# Patient Record
Sex: Male | Born: 1935 | Race: Black or African American | Hispanic: No | Marital: Married | Smoking: Never smoker
Health system: Southern US, Community
[De-identification: ages and names within clinical notes are randomized; demographics above are authoritative.]

---

## 2016-06-23 ENCOUNTER — Emergency Department: Payer: Medicare Other

## 2016-06-23 ENCOUNTER — Emergency Department
Admission: EM | Admit: 2016-06-23 | Discharge: 2016-06-23 | Disposition: A | Discharge status: Home / Self Care | Payer: Medicare Other | Attending: Student in an Organized Health Care Education/Training Program | Admitting: Student in an Organized Health Care Education/Training Program

## 2016-06-23 DIAGNOSIS — M79605 Pain in left leg: Secondary | ICD-10-CM | POA: Insufficient documentation

## 2016-06-23 DIAGNOSIS — M79652 Pain in left thigh: Secondary | ICD-10-CM | POA: Diagnosis not present

## 2016-06-23 DIAGNOSIS — W19XXXA Unspecified fall, initial encounter: Secondary | ICD-10-CM | POA: Diagnosis not present

## 2016-06-23 DIAGNOSIS — Y999 Unspecified external cause status: Secondary | ICD-10-CM | POA: Diagnosis not present

## 2016-06-23 DIAGNOSIS — M898X5 Other specified disorders of bone, thigh: Secondary | ICD-10-CM

## 2016-06-23 DIAGNOSIS — R531 Weakness: Secondary | ICD-10-CM | POA: Diagnosis present

## 2016-06-23 DIAGNOSIS — Y9301 Activity, walking, marching and hiking: Secondary | ICD-10-CM | POA: Diagnosis not present

## 2016-06-23 DIAGNOSIS — Y92009 Unspecified place in unspecified non-institutional (private) residence as the place of occurrence of the external cause: Secondary | ICD-10-CM | POA: Diagnosis not present

## 2016-06-23 LAB — CBC WITH DIFFERENTIAL/PLATELET
Basophils Absolute: 0 10*3/uL (ref 0–0.1)
Basophils Relative: 1 %
Eosinophils Absolute: 0 10*3/uL (ref 0–0.7)
Eosinophils Relative: 0 %
HCT: 40.8 % (ref 40.0–52.0)
Hemoglobin: 14 g/dL (ref 13.0–18.0)
Lymphocytes Relative: 20 %
Lymphs Abs: 1.1 10*3/uL (ref 1.0–3.6)
MCH: 29.6 pg (ref 26.0–34.0)
MCHC: 34.4 g/dL (ref 32.0–36.0)
MCV: 86.2 fL (ref 80.0–100.0)
Monocytes Absolute: 0.7 10*3/uL (ref 0.2–1.0)
Monocytes Relative: 12 %
Neutro Abs: 3.7 10*3/uL (ref 1.4–6.5)
Neutrophils Relative %: 67 %
Platelets: 134 10*3/uL — ABNORMAL LOW (ref 150–440)
RBC: 4.73 MIL/uL (ref 4.40–5.90)
RDW: 13.9 % (ref 11.5–14.5)
WBC: 5.5 10*3/uL (ref 3.8–10.6)

## 2016-06-23 LAB — COMPREHENSIVE METABOLIC PANEL
ALT: 10 U/L — ABNORMAL LOW (ref 17–63)
AST: 33 U/L (ref 15–41)
Albumin: 3.8 g/dL (ref 3.5–5.0)
Alkaline Phosphatase: 63 U/L (ref 38–126)
Anion gap: 6 (ref 5–15)
BUN: 15 mg/dL (ref 6–20)
CO2: 29 mmol/L (ref 22–32)
Calcium: 9.5 mg/dL (ref 8.9–10.3)
Chloride: 102 mmol/L (ref 101–111)
Creatinine, Ser: 1 mg/dL (ref 0.61–1.24)
GFR calc Af Amer: >60 mL/min (ref >60)
GFR calc non Af Amer: >60 mL/min (ref >60)
Glucose, Bld: 113 mg/dL — ABNORMAL HIGH (ref 65–99)
Potassium: 4.2 mmol/L (ref 3.5–5.1)
Sodium: 137 mmol/L (ref 135–145)
Total Bilirubin: 0.8 mg/dL (ref 0.3–1.2)
Total Protein: 7.6 g/dL (ref 6.5–8.1)

## 2016-06-23 LAB — URINALYSIS COMPLETE WITH MICROSCOPIC (ARMC ONLY)
Bacteria, UA: NONE SEEN
Bilirubin Urine: NEGATIVE
Glucose, UA: NEGATIVE mg/dL
Hgb urine dipstick: NEGATIVE
Ketones, ur: NEGATIVE mg/dL
Leukocytes, UA: NEGATIVE
Nitrite: NEGATIVE
Protein, ur: NEGATIVE mg/dL
Specific Gravity, Urine: 1.008 (ref 1.005–1.030)
pH: 8 (ref 5.0–8.0)

## 2016-06-23 LAB — CK: Total CK: 142 U/L (ref 49–397)

## 2016-06-23 NOTE — ED Notes (Signed)
Pt brought in via ems from home with weakness to left leg.  Pt states he was walking and leg just gave out.  Pt did not fall.  Pt denies headache.  No chest pain or sob.  No known injury to left leg.  Pt reports he had a severe pain in left hip several days ago, but did not fall.  Pt ambulates with a cane prn.  Iv started and labs sent.  Family with pt in hallway.

## 2016-06-23 NOTE — ED Provider Notes (Signed)
[facility] Emergency Department Provider Note    First MD Initiated Contact with Patient 06/23/16 1614     (approximate)  I have reviewed the triage vital signs and the nursing notes.   HISTORY  Chief Complaint Weakness    HPI Bob Hebert is a 82 y.o. male chief complaint of weakness to his left leg that started several days prior. States he generally ambulates with a cane due to chronic arthritis and left hip pain. States over the past 3 days he's been having difficulty ambulating due to worsening weakness and feeling that his left leg will give out. Denies falling and hitting his head. Denies any sudden onset of back pain. No numbness or tingling. No loss of control of his bowel or bladder. No recent fevers. No cough or shortness of breath. Denies any chest pain. He called EMS today because as he was walking at this Is using a hand rail he was stepping up 1 step and while trying to go up he felt his left knee give out.   History reviewed. No pertinent past medical history.  There are no active problems to display for this patient.   History reviewed. No pertinent surgical history.  Prior to Admission medications   Not on File    Allergies Review of patient's allergies indicates no known allergies.  No family history on file.  Social History Social History  Substance Use Topics  . Smoking status: Never Smoker  . Smokeless tobacco: Never Used  . Alcohol use No    Review of Systems Patient denies headaches, rhinorrhea, blurry vision, numbness, shortness of breath, chest pain, edema, cough, abdominal pain, nausea, vomiting, diarrhea, dysuria, fevers, rashes or hallucinations unless otherwise stated above in HPI. ____________________________________________   PHYSICAL EXAM:  VITAL SIGNS: Vitals:   06/23/16 1725 06/23/16 2007  BP: (!) 171/93 (!) 169/101  Pulse: 70 64  Resp: 16 18  Temp:      Constitutional: Alert and oriented.  Elderly and frail , Well appearing and in no acute distress. Eyes: Conjunctivae are normal. PERRL. EOMI. Head: Atraumatic. Nose: No congestion/rhinnorhea. Mouth/Throat: Mucous membranes are moist.  Oropharynx non-erythematous. Neck: No stridor. Painless ROM. No cervical spine tenderness to palpation Hematological/Lymphatic/Immunilogical: No cervical lymphadenopathy. Cardiovascular: Normal rate, regular rhythm. Grossly normal heart sounds.  Good peripheral circulation. Respiratory: Normal respiratory effort.  No retractions. Lungs CTAB. Gastrointestinal: Soft and nontender. No distention. No abdominal bruits. No CVA tenderness. Genitourinary:  Musculoskeletal: No lower extremity tenderness nor edema.  No joint effusions.  No pain with internal or external rotation of the left hip. No pain with palpation of the left knee but he does have crepitus at time upon extension and flexion. Sensation is intact to light touch distally. Patient does have 4 out of 5 strength of the quadriceps. Normal dorsiflexion of the ankle in plantar flexion. No weakness with flexion of the knee. Neurologic:  Normal speech and language. No gross focal neurologic deficits are appreciated. No gait instability. Skin:  Skin is warm, dry and intact. No rash noted. Psychiatric: Mood and affect are normal. Speech and behavior are normal.  ____________________________________________   LABS (all labs ordered are listed, but only abnormal results are displayed)  No results found for this or any previous visit (from the past 24 hour(s)). ____________________________________________  ____________________________________________  RADIOLOGY  See chart for details ____________________________________________   PROCEDURES  Procedure(s) performed: none    Critical Care performed: no ____________________________________________   INITIAL IMPRESSION / ASSESSMENT AND PLAN /  ED COURSE  Pertinent labs & imaging results  that were available during my care of the patient were reviewed by me and considered in my medical decision making (see chart for details).  DDX: Fracture, contusion, muscle strain, arthritis, CVA, sciatica  Bob Hebert is a 82 y.o. who presents to the ED with intermittent weakness of the left lower extremity causing the patient a fall. Patient without any focal cranial nerve deficits. Based on his symptoms will order CT imaging and radiographic imaging to evaluate for acute traumatic injury. Do not feel his presentation is consistent with cauda equina or spinal stenosis.  Clinical Course  Comment By Time  CT head with no acute abnormality. X-ray lumbar spine shows no acute fracture or malalignment. There is a nonspecific lucency midshaft left femur and based on the patient's pain will further characterize the CT imaging to rule out a subtle fracture. Willy Eddy, MD 09/05 1751  Patient reassessed remains dynamically stable. No acute low back pain on exam. CT without evidence of fracture.  Willy Eddy, MD 09/05 1928  Will ambulate patient. Willy Eddy, MD 09/05 1929  Patient was able to ambulate with a steady gait with use of a walker assistive device. Feel patient is currently stable for further outpatient management. Does not have any persistent neurologic deficits.  Have discussed with the patient and available family all diagnostics and treatments performed thus far and all questions were answered to the best of my ability. The patient demonstrates understanding and agreement with plan.  Willy Eddy, MD 09/05 2000     ____________________________________________   FINAL CLINICAL IMPRESSION(S) / ED DIAGNOSES  Final diagnoses:  Leg pain, anterior, left      NEW MEDICATIONS STARTED DURING THIS VISIT:  New Prescriptions   No medications on file     Note:  This document was prepared using Dragon voice recognition software and may include unintentional  dictation errors.    Willy Eddy, MD 06/23/16 2124

## 2016-06-23 NOTE — ED Notes (Signed)
Pt ambulated with walker with assistance by nurse.  Family with pt in hallway bed.  Pt alert and oriented.

## 2016-06-23 NOTE — ED Triage Notes (Signed)
Pt comes into the ED via EMS from home with c/o left leg weakness, states it just gave out on him and he almost fell today.. Pt normally walks with a cane

## 2016-06-23 NOTE — ED Notes (Signed)
Pt to xray

## 2017-07-12 IMAGING — CR DG LUMBAR SPINE 2-3V
3 series · 3 of 3 positions shown · non-contrast
Comparison: None.

CLINICAL DATA: Back pain and left lower extremity weakness. No
reported injury.

EXAM:
LUMBAR SPINE - 2-3 VIEW

[l-spine ap]
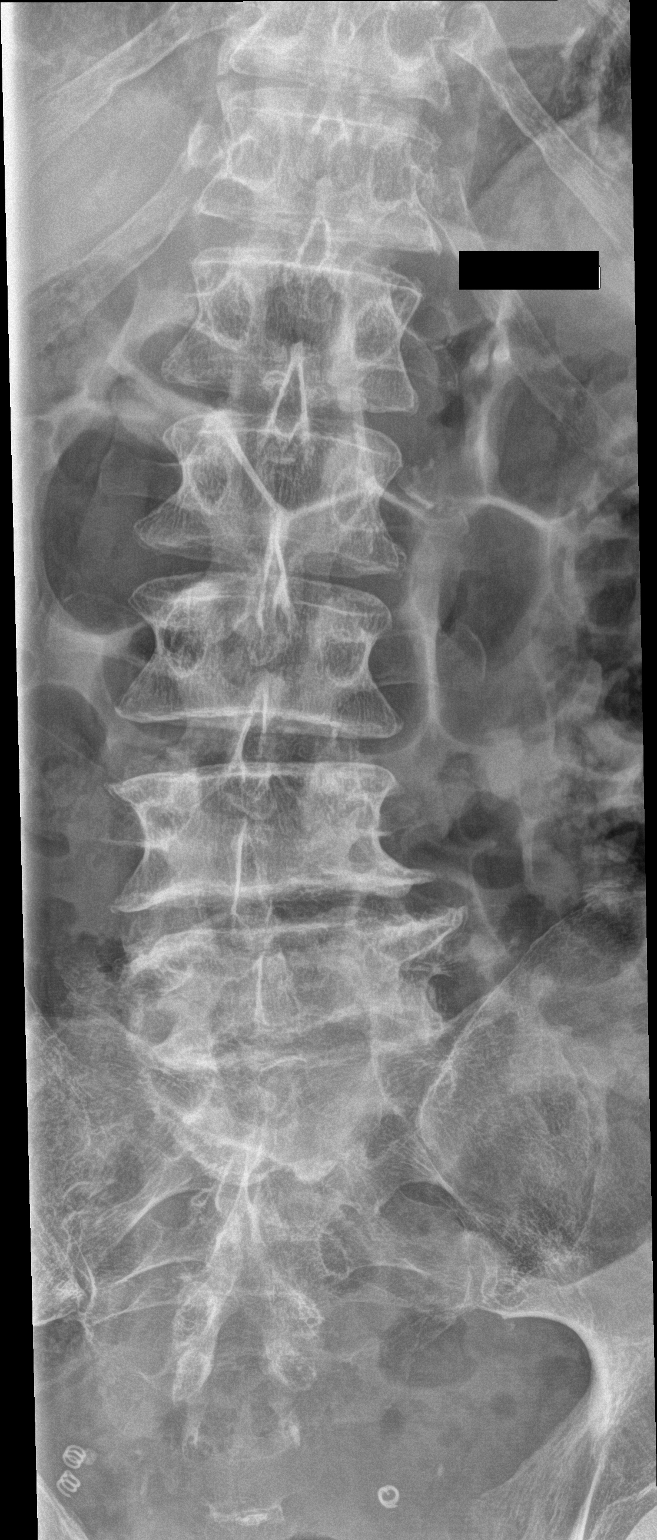

[l-spine lat]
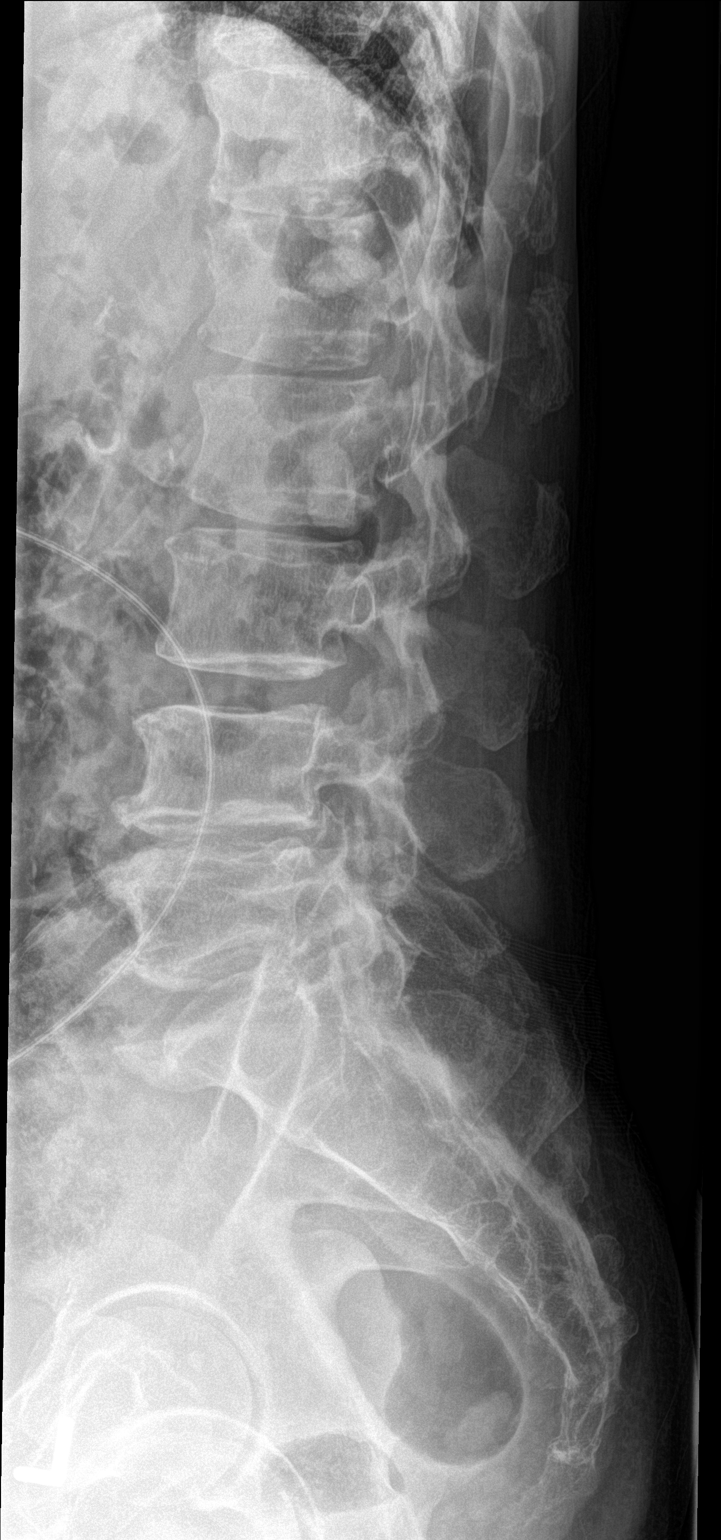

[l-spine spot]
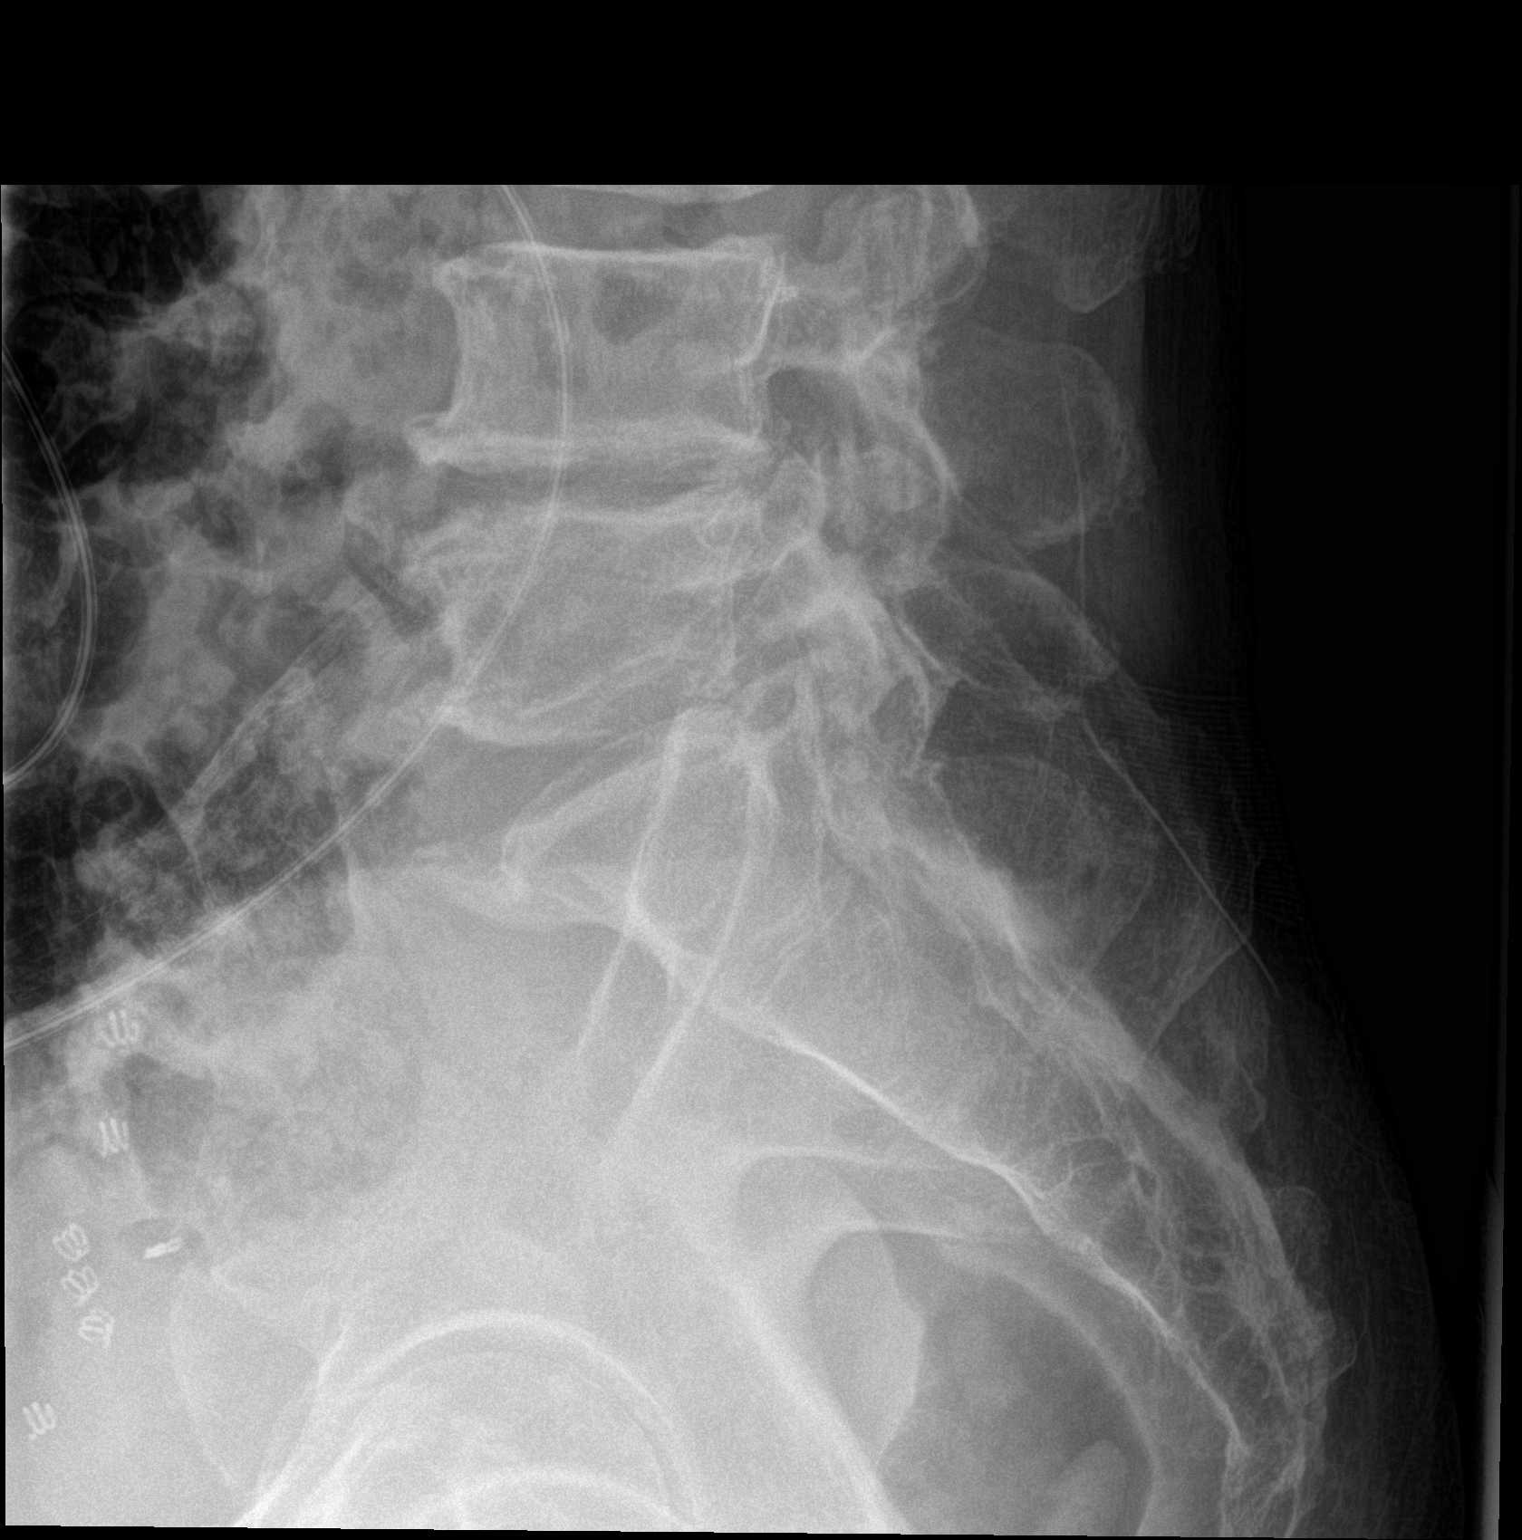

[3 of 3 positions shown; findings below may reference images not displayed]

FINDINGS: This report assumes 5 non rib-bearing lumbar vertebrae.

Lumbar vertebral body heights are preserved, with no fracture.

Mild spondylosis and mild loss of disc height at L2-3. Moderate
degenerative disc disease at L4-5 with mild loss of disc height.
Moderate spondylosis without significant loss of disc height at
L5-S1. Minimal 3 mm retrolisthesis at L4-5. Mild facet arthropathy
bilaterally in the lower lumbar spine. No aggressive appearing focal
osseous lesions. Aortic atherosclerosis.
IMPRESSION: 1. No fracture in the lumbar spine .
2. Moderate multilevel degenerative disc disease in lumbar spine.
3. Minimal 3 mm retrolisthesis at L4-5, probably degenerative .
4. Mild facet arthropathy bilaterally in the lower lumbar spine.
5. Aortic atherosclerosis.

## 2017-07-12 IMAGING — CT CT HEAD W/O CM
3 series · 15 of 47 positions shown, 18 images · non-contrast
Comparison: None.

CLINICAL DATA: 85-year-old male with left-sided weakness. Fall. No
known injuries. Initial encounter.

EXAM:
CT HEAD WITHOUT CONTRAST
TECHNIQUE: Contiguous axial images were obtained from the base of the skull
through the vertex without intravenous contrast.

[Series 2: head wo · axial · 0.43mm/px · z∈[-122,+3]mm · 9 of 30 slices shown, 12 images]
[im 3/30  brain]
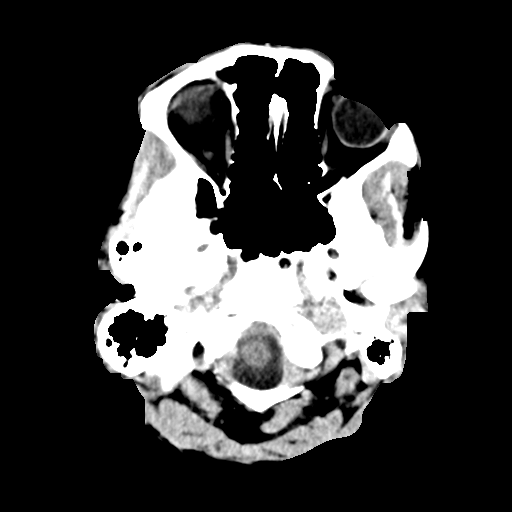
[im 3/30  bone]
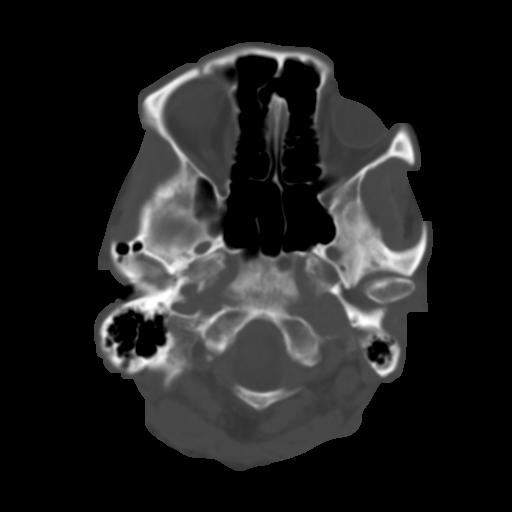
[im 6/30  brain]
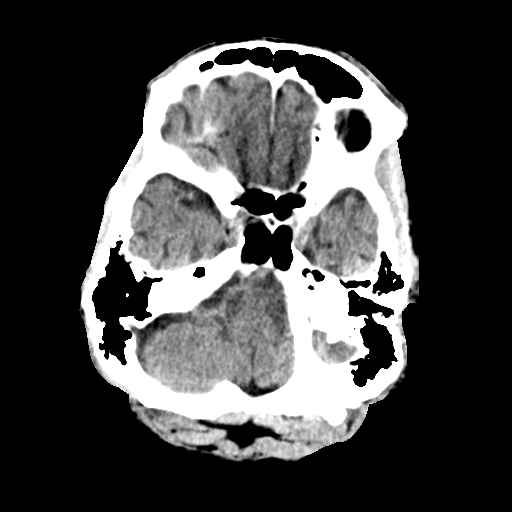
[im 9/30  brain]
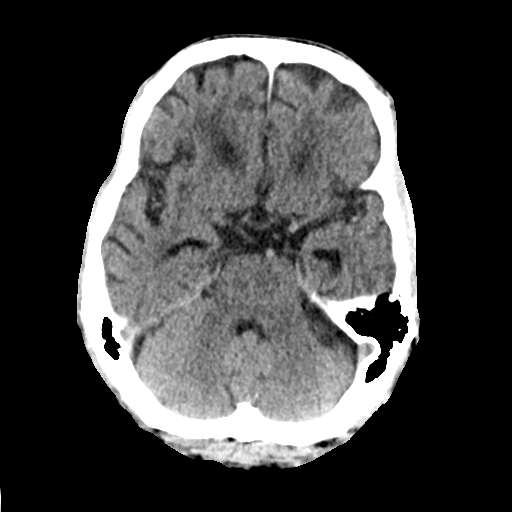
[im 12/30  brain]
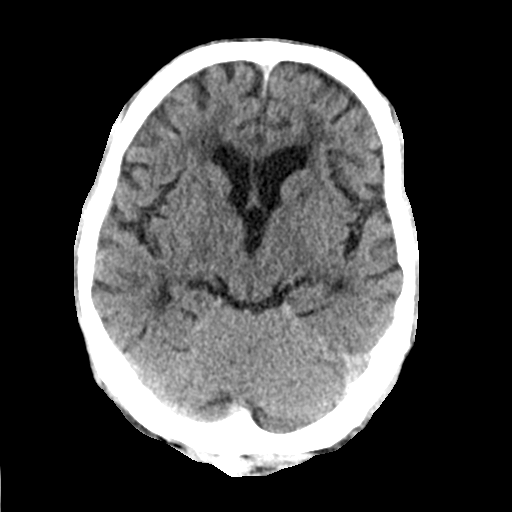
[im 16/30  brain]
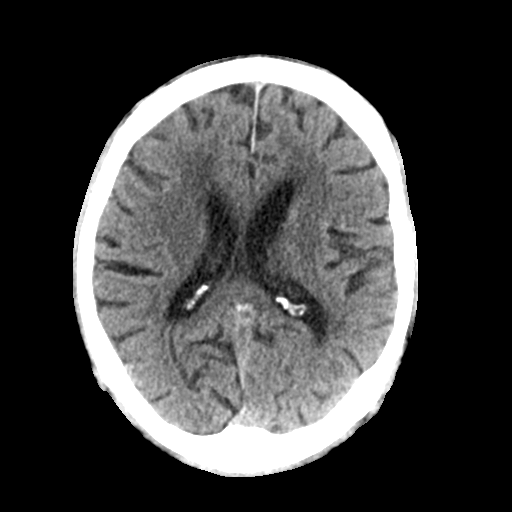
[im 16/30  bone]
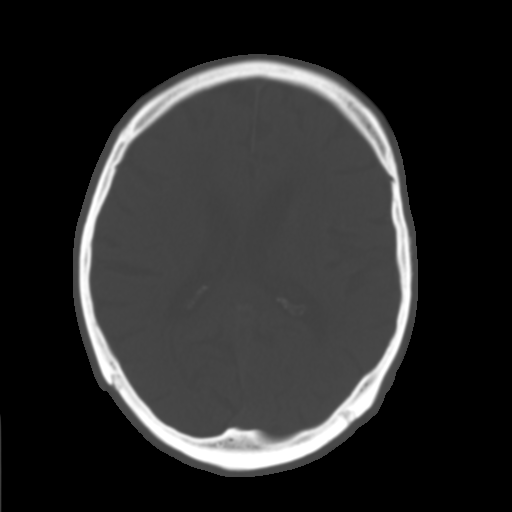
[im 19/30  brain]
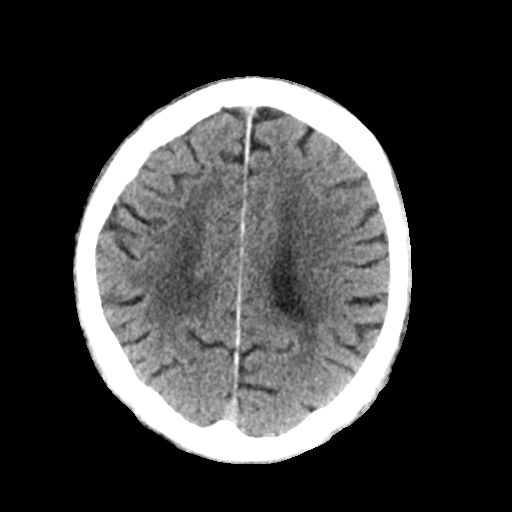
[im 22/30  brain]
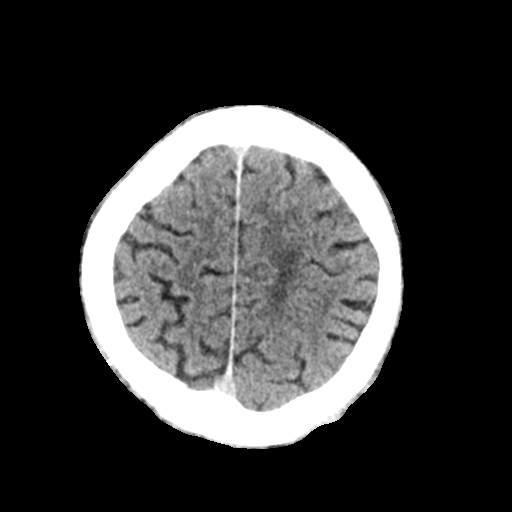
[im 25/30  brain]
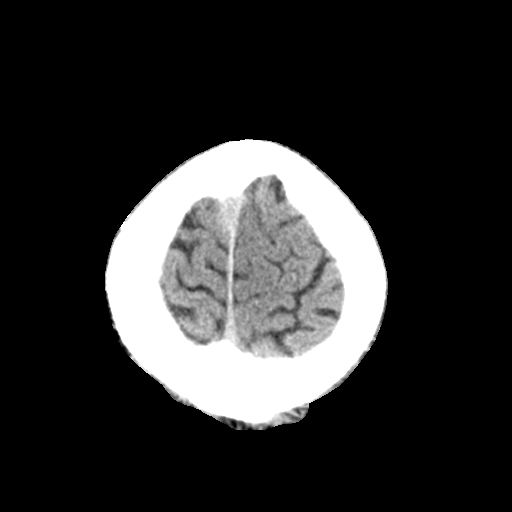
[im 28/30  brain]
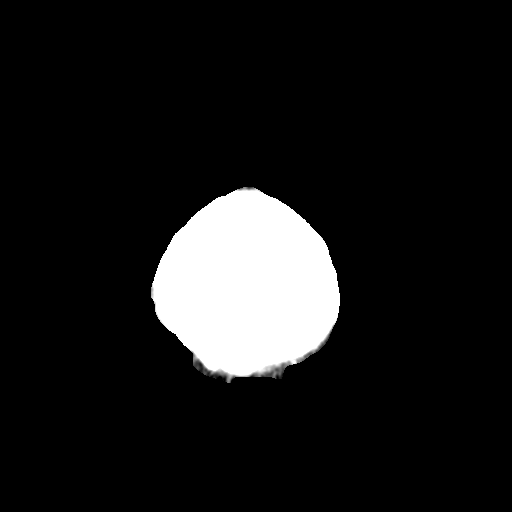
[im 28/30  bone]
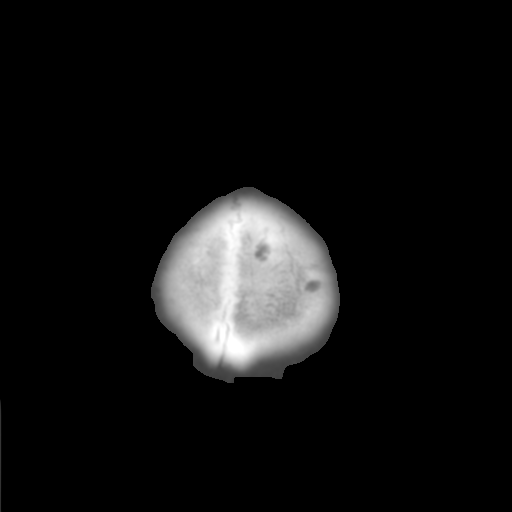

[Series 4: coronal soft tissue · coronal · 0.32mm/px · 3 of 70 slices shown]
[im 24/70  brain]
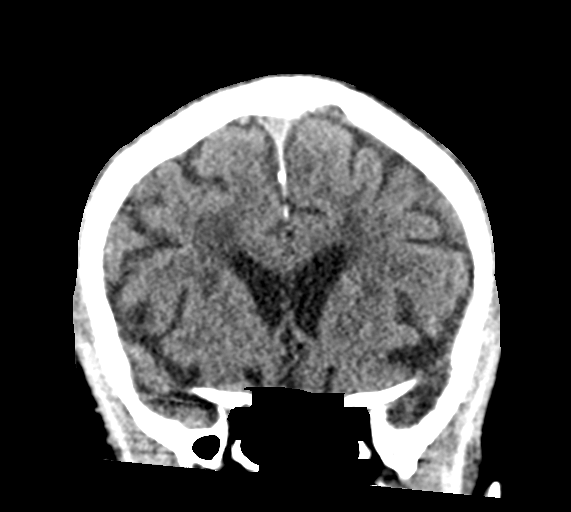
[im 31/70  brain]
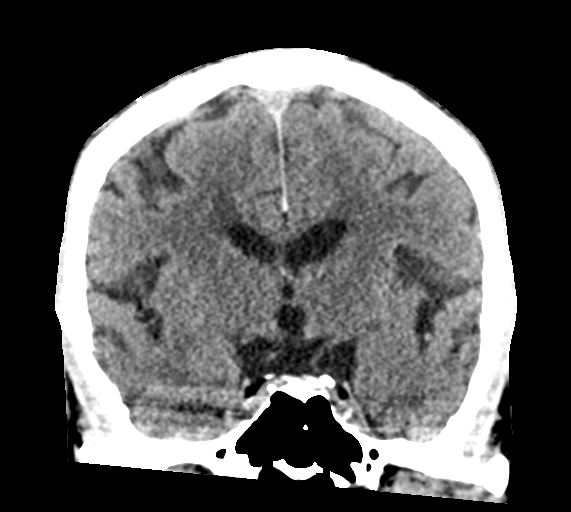
[im 39/70  brain]
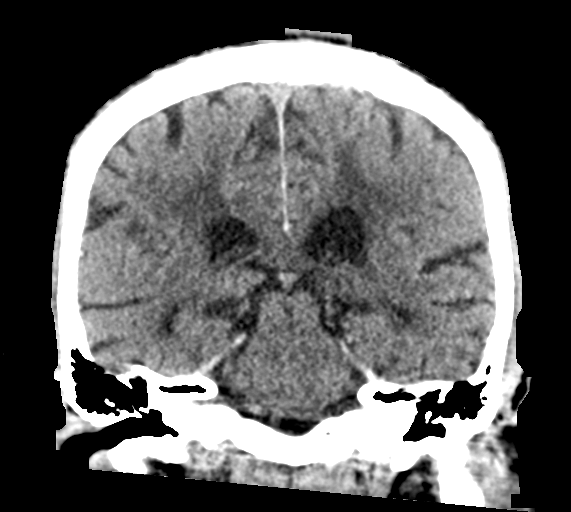

[Series 5: sagittal soft tissue · sagittal · 0.34mm/px · 3 of 55 slices shown]
[im 19/55  brain]
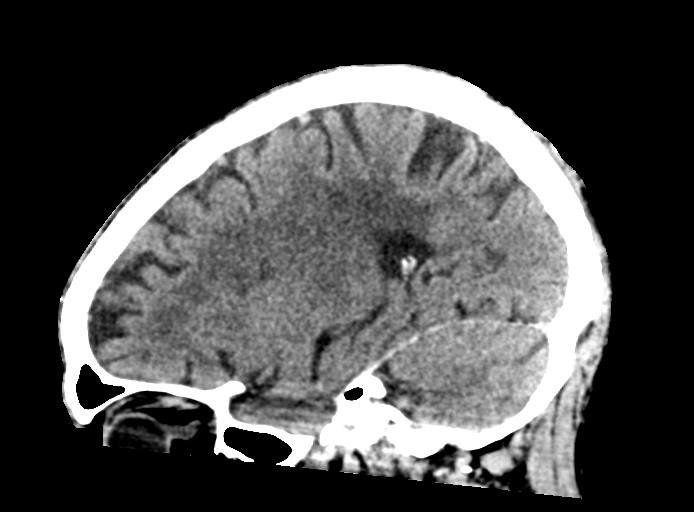
[im 28/55  brain]
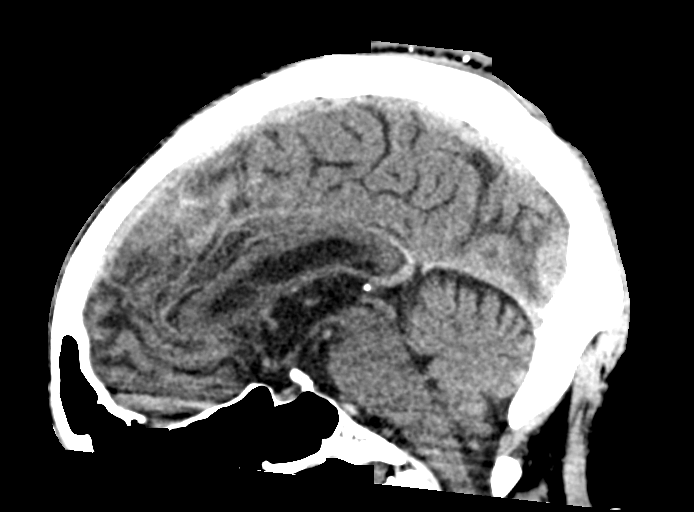
[im 37/55  brain]
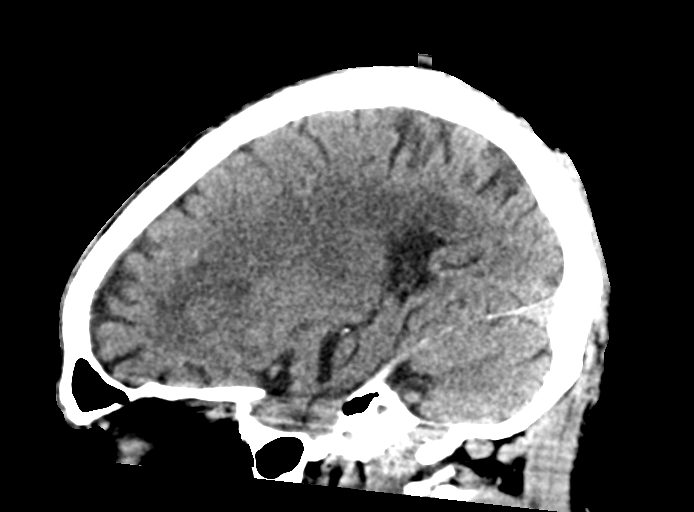

[15 of 47 positions shown; findings below may reference images not displayed]

FINDINGS: Brain: No intracranial hemorrhage. Small vessel disease changes
without CT evidence of large acute infarct. No intracranial mass
lesion noted on this unenhanced exam.

Vascular: No acute abnormality.

Skull: No skull fracture.

Sinuses/Orbits: Visualized sinuses, mastoid air cells and middle ear
cavities are clear. Visualized orbital structures unremarkable.

Other: Negative.
IMPRESSION: No skull fracture or intracranial hemorrhage.

Small vessel disease changes without CT evidence of large acute
infarct.
